# Patient Record
Sex: Male | Born: 1975 | Race: White | Hispanic: No | Marital: Single | Smoking: Never smoker
Health system: Southern US, Community
[De-identification: ages and names within clinical notes are randomized; demographics above are authoritative.]

## PROBLEM LIST (undated history)

## (undated) DIAGNOSIS — M539 Dorsopathy, unspecified: Secondary | ICD-10-CM

## (undated) HISTORY — DX: Dorsopathy, unspecified: M53.9

## (undated) HISTORY — PX: APPENDECTOMY: SHX54

---

## 2005-07-01 ENCOUNTER — Inpatient Hospital Stay (HOSPITAL_COMMUNITY): Admission: EM | Admit: 2005-07-01 | Discharge: 2005-07-01 | Payer: Self-pay | Admitting: Emergency Medicine

## 2006-11-27 IMAGING — CT CT PELVIS W/ CM
1 of 6 series · 12 of 32 positions shown, 18 images · IV contrast (100 ML OMNI 300)
Comparison: none

CLINICAL DATA: Question appendicitis. 
 ABDOMEN CT WITH CONTRAST:
TECHNIQUE: Multidetector CT imaging of the abdomen was performed following the standard protocol during bolus administration of intravenous contrast.
 Contrast:  100 cc Omnipaque 300
TECHNIQUE: Multidetector CT imaging of the pelvis was performed following the standard protocol during bolus administration of intravenous contrast.

[Series 102: routine abdomen · axial · 0.81mm/px · z∈[-553,-146]mm · 12 of 385 slices shown, 18 images]
[im 30/385  soft-tissue]
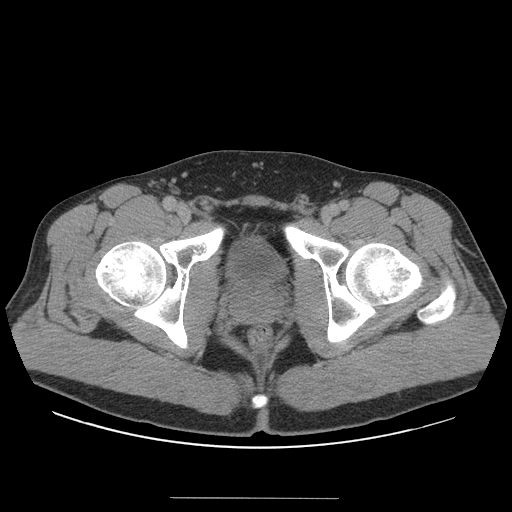
[im 30/385  bone]
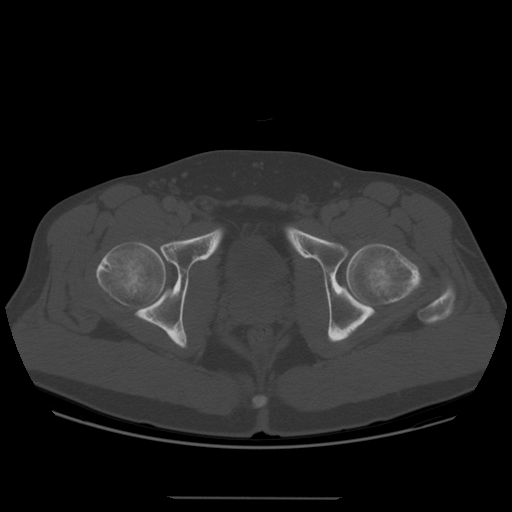
[im 60/385  soft-tissue]
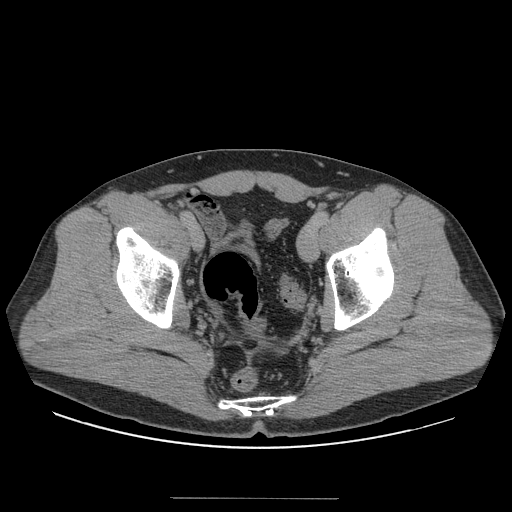
[im 89/385  soft-tissue]
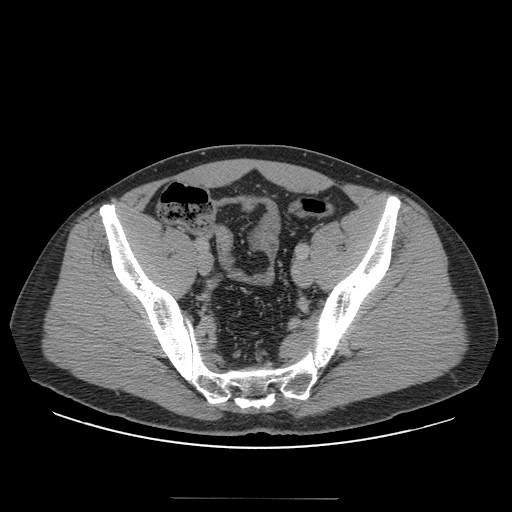
[im 119/385  soft-tissue]
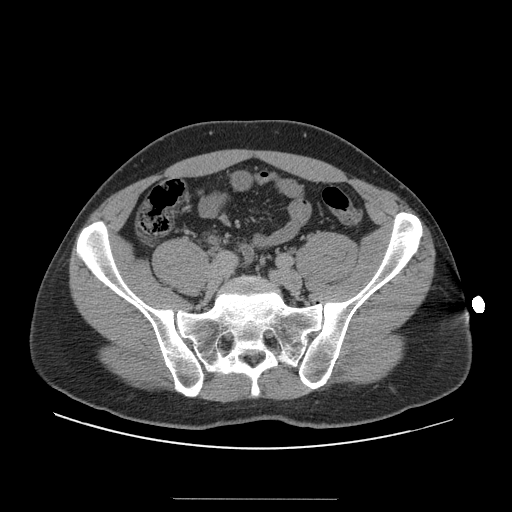
[im 148/385  soft-tissue]
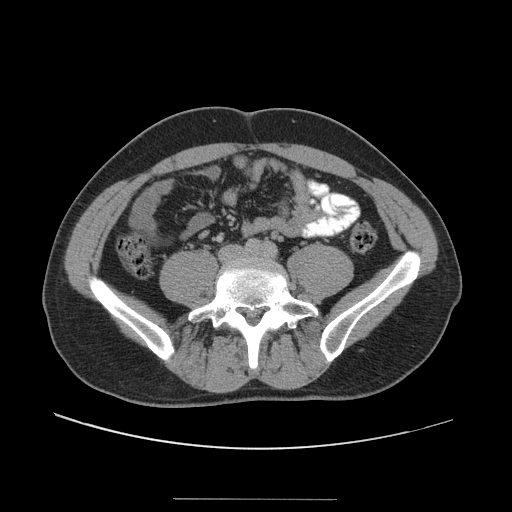
[im 178/385  soft-tissue]
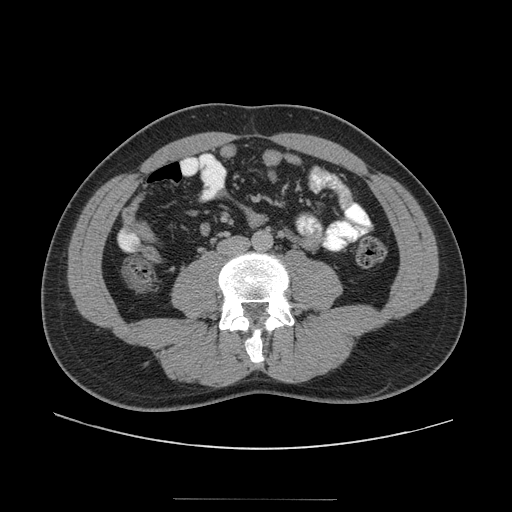
[im 207/385  soft-tissue]
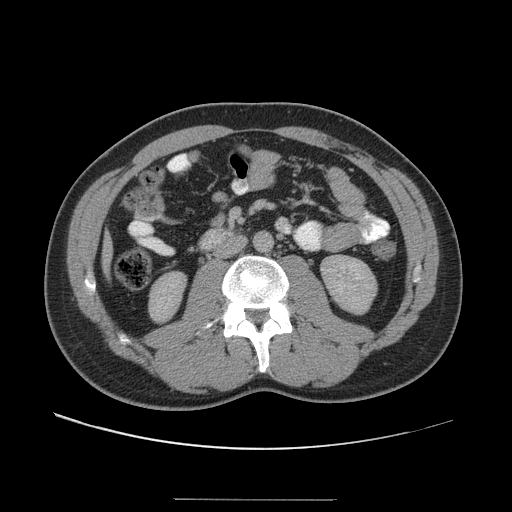
[im 237/385  soft-tissue]
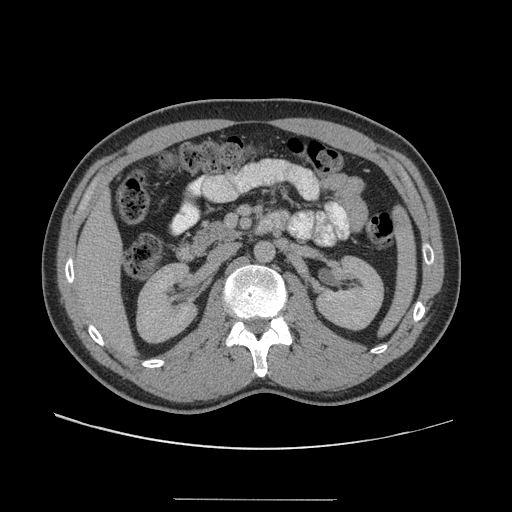
[im 266/385  soft-tissue]
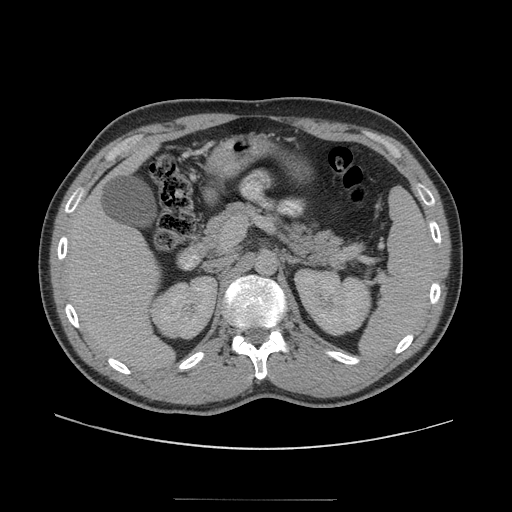
[im 266/385  lung]
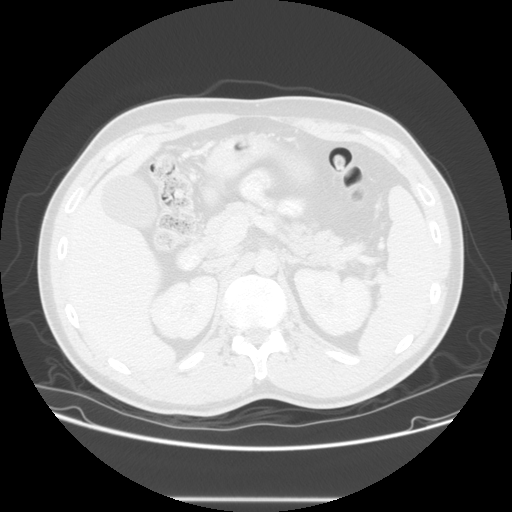
[im 266/385  bone]
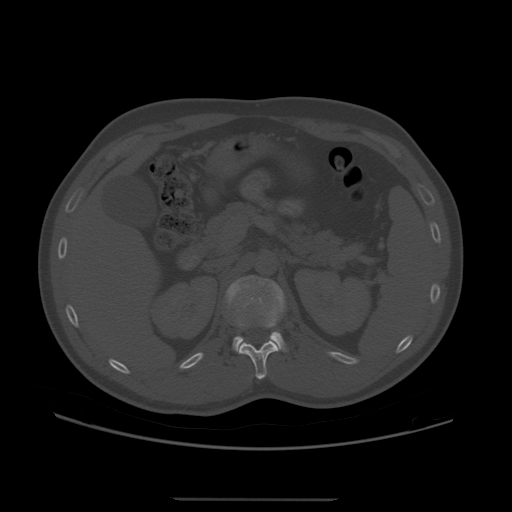
[im 296/385  soft-tissue]
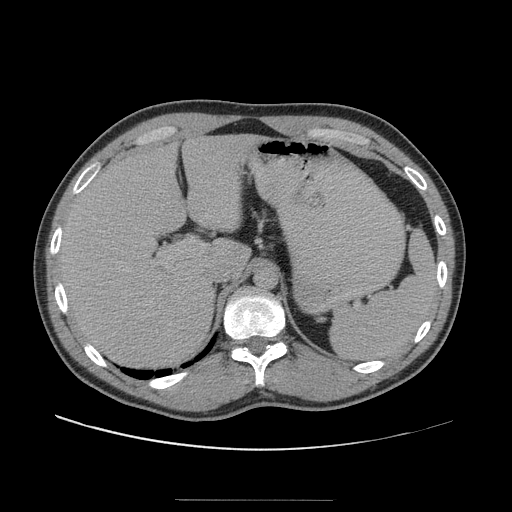
[im 296/385  lung]
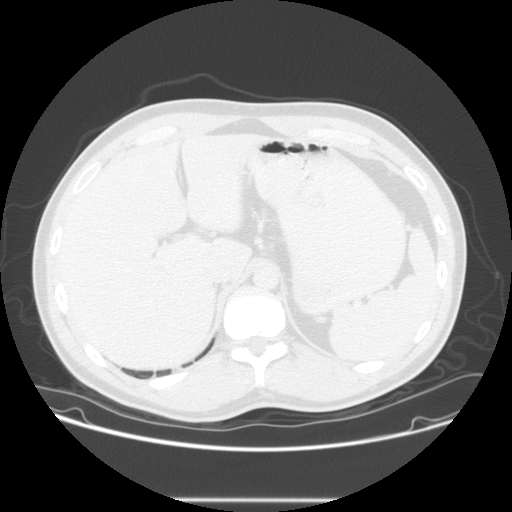
[im 325/385  soft-tissue]
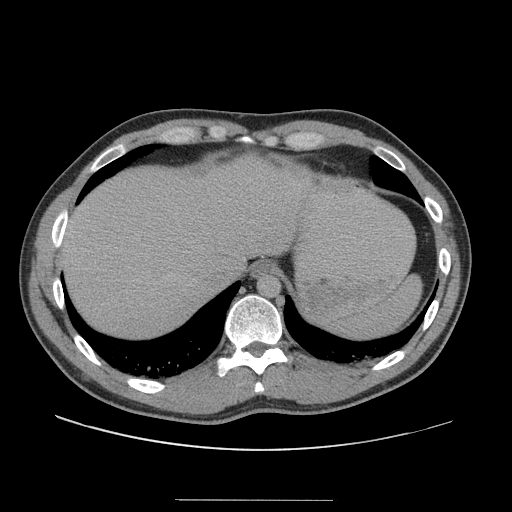
[im 325/385  lung]
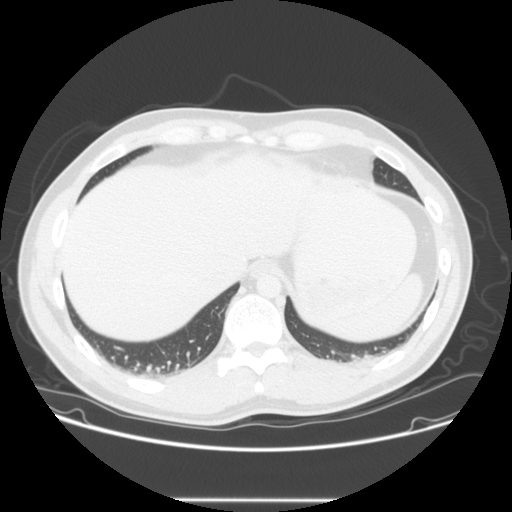
[im 355/385  soft-tissue]
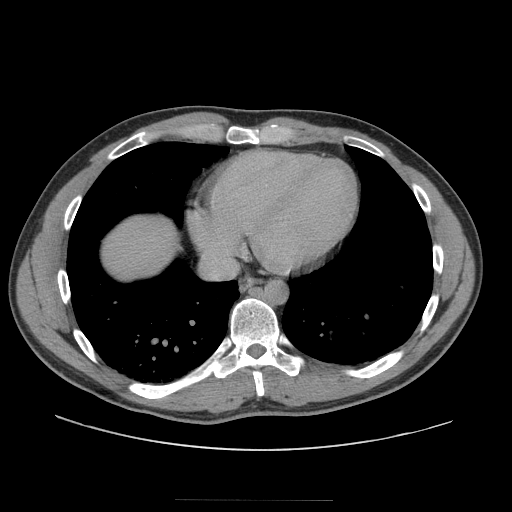
[im 355/385  lung]
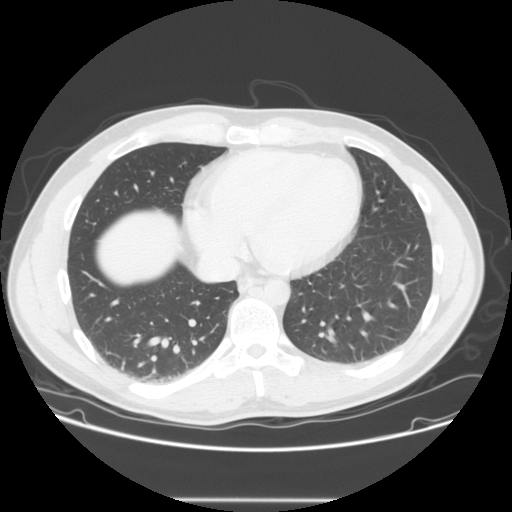

[12 of 32 positions shown; findings below may reference images not displayed]

FINDINGS: The lung bases are clear.  
 No pleural or pericardial effusions. 
 The liver is normal in attenuation and morphology.  
 There is no intrahepatic biliary ductal dilatation.  
 The gallbladder is negative.  
 The spleen is negative.  
 The pancreas is negative.  
 Left kidney negative.  Right kidney negative.  Adrenal glands are negative.  Retroperitoneal lymph nodes are non-pathologically enlarged.  Mesenteric lymph nodes are non-pathologically enlarged.  Small bowel loops are all normal.  There is trace free fluid within the right lower quadrant.  The appendix is abnormally thickened with periappendiceal fat stranding consistent with acute appendicitis.
IMPRESSION: Abnormally thickened appendix with periappendiceal fat stranding consistent with acute appendicitis. 
 PELVIS CT WITH CONTRAST:
FINDINGS: There is trace free fluid.  Thickened appendix is noted.  Findings consistent with acute appendicitis.
IMPRESSION: Acute appendicitis.

## 2013-11-06 ENCOUNTER — Ambulatory Visit: Payer: Self-pay | Admitting: Podiatrist

## 2013-11-13 ENCOUNTER — Encounter: Payer: Self-pay | Admitting: Podiatrist

## 2013-11-13 ENCOUNTER — Ambulatory Visit (INDEPENDENT_AMBULATORY_CARE_PROVIDER_SITE_OTHER): Payer: BC Managed Care – PPO

## 2013-11-13 ENCOUNTER — Ambulatory Visit (INDEPENDENT_AMBULATORY_CARE_PROVIDER_SITE_OTHER): Payer: BC Managed Care – PPO | Admitting: Podiatrist

## 2013-11-13 VITALS — BP 110/74 | HR 69 | Resp 16 | Ht 76.0 in | Wt 230.0 lb

## 2013-11-13 DIAGNOSIS — M722 Plantar fascial fibromatosis: Secondary | ICD-10-CM

## 2013-11-13 NOTE — Progress Notes (Signed)
   Subjective:    Patient ID: Brett Moran, male    DOB: 08/19/1975, 38 y.o.   MRN: [id number]  HPI Comments: "I feel like a I have plantar fasciitis"  Patient c/o aching plantar heels bilateral, left over right, for about 3-4 months. He does have some AM pain. He initially was having sharp pains medial heel and foot, but then it moved more into the heels. He has tried a night brace, takes naproxen for his back, ultrasound treatments from chiropractor-no help.     Review of Systems  Musculoskeletal: Positive for back pain.  All other systems reviewed and are negative.      Objective:   Physical Exam GENERAL APPEARANCE: Alert, conversant. Appropriately groomed. No acute distress.  VASCULAR: Pedal pulses palpable and strong bilateral.  Capillary refill time is immediate to all digits,  Proximal to distal cooling it warm to warm.  Digital hair growth is present bilateral  NEUROLOGIC: sensation is intact epicritically and protectively to 5.07 monofilament at 5/5 sites bilateral.  Light touch is intact bilateral, vibratory sensation intact bilateral, achilles tendon reflex is intact bilateral.  Negative Tinel sign is elicited MUSCULOSKELETAL: Pain on palpation plantar medial aspect both feet  at insertion of plantar fascia on the medial calcaneal tubercle. Inflammation at the insertion of the plantar fascia is present. Rectus foot type is seen.  In general the pain is improving with the conservative treatments he has been performing and the pad in his shoe DERMATOLOGIC: skin color, texture, and turger are within normal limits.  No preulcerative lesions are seen, no interdigital maceration noted.  No open lesions present.  Digital nails are asymptomatic.      Assessment & Plan:  Plantar fasciitis bilateral improving  Plan: Recommended orthotics and he was scanned at today's visit recommended continuing good stretching and icing exercises as well as use of a night splint. He will be seen back  when the inserts are ready for pick up and if the pain continues an injection will be  warranted at that time.

## 2013-11-13 NOTE — Patient Instructions (Signed)

## 2013-12-13 ENCOUNTER — Other Ambulatory Visit: Payer: BC Managed Care – PPO

## 2013-12-17 ENCOUNTER — Ambulatory Visit (INDEPENDENT_AMBULATORY_CARE_PROVIDER_SITE_OTHER): Payer: BC Managed Care – PPO | Admitting: *Deleted

## 2013-12-17 DIAGNOSIS — M722 Plantar fascial fibromatosis: Secondary | ICD-10-CM

## 2013-12-17 NOTE — Patient Instructions (Signed)

## 2013-12-17 NOTE — Progress Notes (Signed)
   Subjective:    Patient ID: Brett Moran, male    DOB: [birth year], 38 y.o.   MRN: [id number]  HPI  PICK UP  ORTHOTICS AND GIVEN    Review of Systems     Objective:   Physical Exam        Assessment & Plan:

## 2016-02-23 ENCOUNTER — Ambulatory Visit: Payer: Self-pay | Admitting: Sports Medicine

## 2016-03-01 ENCOUNTER — Encounter: Payer: Self-pay | Admitting: Sports Medicine

## 2016-03-01 ENCOUNTER — Ambulatory Visit (INDEPENDENT_AMBULATORY_CARE_PROVIDER_SITE_OTHER): Payer: BLUE CROSS/BLUE SHIELD | Admitting: Sports Medicine

## 2016-03-01 DIAGNOSIS — M79672 Pain in left foot: Secondary | ICD-10-CM

## 2016-03-01 DIAGNOSIS — M79671 Pain in right foot: Secondary | ICD-10-CM

## 2016-03-01 DIAGNOSIS — M722 Plantar fascial fibromatosis: Secondary | ICD-10-CM | POA: Diagnosis not present

## 2016-03-01 NOTE — Progress Notes (Signed)
Subjective: Brett Moran is a 40 y.o. male patient presents to office for evaluation of bilateral heels and for new orthotics. Reports was treated for plantar fasciitis well controlled with orthotics with occasional flare ups and desires new orthotics. Denies any other pedal complaints.   There are no active problems to display for this patient.   Current Outpatient Prescriptions on File Prior to Visit  Medication Sig Dispense Refill  . naproxen (NAPROSYN) 500 MG tablet Take 500 mg by mouth as needed.      No current facility-administered medications on file prior to visit.     No Known Allergies  Objective: Physical Exam General: The patient is alert and oriented x3 in no acute distress.  Dermatology: Skin is warm, dry and supple bilateral lower extremities. Nails 1-10 are normal. There is no erythema, edema, no eccymosis, no open lesions present. Integument is otherwise unremarkable.  Vascular: Dorsalis Pedis pulse and Posterior Tibial pulse are 2/4 bilateral. Capillary fill time is immediate to all digits.  Neurological: Grossly intact to light touch with an achilles reflex of +2/5 and a negative Tinel's sign bilateral.  Musculoskeletal: No reproducibleTenderness to palpation at the medial calcaneal tubercale and through the insertion of the plantar fascia on the left or right foot. No pain with compression of calcaneus bilateral. No pain with tuning fork to calcaneus bilateral. No pain with calf compression bilateral. There is decreased Ankle joint range of motion bilateral. All other joints range of motion within normal limits bilateral. Strength 5/5 in all groups bilateral. Rectus cavus foot type  Current orthotics: No longer provide adaquet support.   Assessment and Plan: Problem List Items Addressed This Visit    None    Visit Diagnoses    Plantar fasciitis, bilateral    -  Primary   Foot pain, bilateral          -Complete examination performed.  -Previous Xrays  reviewed -Discussed with patient in detail the condition of plantar fasciitis, how this occurs and general treatment options. Explained both conservative and surgical treatments.  -Recommend stretching, ice, and motrin for occasional flare ups -Recommended good supportive shoes and  custom molded orthoses. Patient scanned for a new set of custom orthotics with heel offloading, deep heel cup, full length, [facility] -Patient to return to office to pick up orthotics or sooner if problems or questions arise.  Asencion Islamitorya Tre Sanker, DPM

## 2016-03-01 NOTE — Patient Instructions (Signed)

## 2016-03-23 ENCOUNTER — Ambulatory Visit (INDEPENDENT_AMBULATORY_CARE_PROVIDER_SITE_OTHER): Payer: BLUE CROSS/BLUE SHIELD | Admitting: Sports Medicine

## 2016-03-23 DIAGNOSIS — M722 Plantar fascial fibromatosis: Secondary | ICD-10-CM

## 2016-03-23 DIAGNOSIS — M79672 Pain in left foot: Secondary | ICD-10-CM

## 2016-03-23 DIAGNOSIS — M79671 Pain in right foot: Secondary | ICD-10-CM

## 2016-03-23 NOTE — Progress Notes (Signed)
Patient discussed with medical assistant. One pair of Custom Functional orthotics were fitted and dispensed to patient with wear instructions and break in period explained. Patient to follow up as scheduled for continued care or sooner if problems or issues arise. -Dr. Bernadetta Roell  

## 2017-07-21 DIAGNOSIS — Z0100 Encounter for examination of eyes and vision without abnormal findings: Secondary | ICD-10-CM | POA: Diagnosis not present

## 2018-04-12 DIAGNOSIS — Z131 Encounter for screening for diabetes mellitus: Secondary | ICD-10-CM | POA: Diagnosis not present

## 2018-04-12 DIAGNOSIS — Z0000 Encounter for general adult medical examination without abnormal findings: Secondary | ICD-10-CM | POA: Diagnosis not present

## 2018-04-12 DIAGNOSIS — Z13220 Encounter for screening for lipoid disorders: Secondary | ICD-10-CM | POA: Diagnosis not present

## 2018-06-05 DIAGNOSIS — B078 Other viral warts: Secondary | ICD-10-CM | POA: Diagnosis not present

## 2018-06-05 DIAGNOSIS — D1801 Hemangioma of skin and subcutaneous tissue: Secondary | ICD-10-CM | POA: Diagnosis not present

## 2018-06-05 DIAGNOSIS — D0422 Carcinoma in situ of skin of left ear and external auricular canal: Secondary | ICD-10-CM | POA: Diagnosis not present

## 2018-06-21 DIAGNOSIS — D0422 Carcinoma in situ of skin of left ear and external auricular canal: Secondary | ICD-10-CM | POA: Diagnosis not present

## 2018-07-23 DIAGNOSIS — L308 Other specified dermatitis: Secondary | ICD-10-CM | POA: Diagnosis not present

## 2018-07-23 DIAGNOSIS — D0422 Carcinoma in situ of skin of left ear and external auricular canal: Secondary | ICD-10-CM | POA: Diagnosis not present

## 2018-07-27 DIAGNOSIS — H52222 Regular astigmatism, left eye: Secondary | ICD-10-CM | POA: Diagnosis not present

## 2018-07-27 DIAGNOSIS — H5203 Hypermetropia, bilateral: Secondary | ICD-10-CM | POA: Diagnosis not present

## 2018-07-27 DIAGNOSIS — H524 Presbyopia: Secondary | ICD-10-CM | POA: Diagnosis not present

## 2018-09-27 DIAGNOSIS — L03116 Cellulitis of left lower limb: Secondary | ICD-10-CM | POA: Diagnosis not present

## 2019-01-31 DIAGNOSIS — L814 Other melanin hyperpigmentation: Secondary | ICD-10-CM | POA: Diagnosis not present

## 2019-01-31 DIAGNOSIS — D1801 Hemangioma of skin and subcutaneous tissue: Secondary | ICD-10-CM | POA: Diagnosis not present

## 2019-01-31 DIAGNOSIS — D225 Melanocytic nevi of trunk: Secondary | ICD-10-CM | POA: Diagnosis not present

## 2019-01-31 DIAGNOSIS — L918 Other hypertrophic disorders of the skin: Secondary | ICD-10-CM | POA: Diagnosis not present

## 2019-05-13 DIAGNOSIS — J019 Acute sinusitis, unspecified: Secondary | ICD-10-CM | POA: Diagnosis not present

## 2019-05-13 DIAGNOSIS — Z1159 Encounter for screening for other viral diseases: Secondary | ICD-10-CM | POA: Diagnosis not present

## 2019-05-13 DIAGNOSIS — Z20828 Contact with and (suspected) exposure to other viral communicable diseases: Secondary | ICD-10-CM | POA: Diagnosis not present

## 2019-09-08 DIAGNOSIS — U071 COVID-19: Secondary | ICD-10-CM | POA: Diagnosis not present

## 2019-09-08 DIAGNOSIS — Z1152 Encounter for screening for COVID-19: Secondary | ICD-10-CM | POA: Diagnosis not present

## 2019-10-05 DIAGNOSIS — L03116 Cellulitis of left lower limb: Secondary | ICD-10-CM | POA: Diagnosis not present

## 2019-10-24 DIAGNOSIS — Z85828 Personal history of other malignant neoplasm of skin: Secondary | ICD-10-CM | POA: Diagnosis not present

## 2019-10-24 DIAGNOSIS — L570 Actinic keratosis: Secondary | ICD-10-CM | POA: Diagnosis not present

## 2019-10-24 DIAGNOSIS — L905 Scar conditions and fibrosis of skin: Secondary | ICD-10-CM | POA: Diagnosis not present

## 2019-10-24 DIAGNOSIS — L300 Nummular dermatitis: Secondary | ICD-10-CM | POA: Diagnosis not present

## 2020-07-15 DIAGNOSIS — U071 COVID-19: Secondary | ICD-10-CM | POA: Diagnosis not present

## 2020-11-15 DIAGNOSIS — Z03818 Encounter for observation for suspected exposure to other biological agents ruled out: Secondary | ICD-10-CM | POA: Diagnosis not present

## 2020-11-15 DIAGNOSIS — N390 Urinary tract infection, site not specified: Secondary | ICD-10-CM | POA: Diagnosis not present

## 2020-11-17 DIAGNOSIS — R300 Dysuria: Secondary | ICD-10-CM | POA: Diagnosis not present

## 2020-11-18 DIAGNOSIS — R300 Dysuria: Secondary | ICD-10-CM | POA: Diagnosis not present

## 2020-11-18 DIAGNOSIS — R109 Unspecified abdominal pain: Secondary | ICD-10-CM | POA: Diagnosis not present

## 2020-11-20 DIAGNOSIS — R748 Abnormal levels of other serum enzymes: Secondary | ICD-10-CM | POA: Diagnosis not present

## 2020-12-21 DIAGNOSIS — J019 Acute sinusitis, unspecified: Secondary | ICD-10-CM | POA: Diagnosis not present

## 2021-04-26 DIAGNOSIS — H524 Presbyopia: Secondary | ICD-10-CM | POA: Diagnosis not present

## 2021-04-26 DIAGNOSIS — H5203 Hypermetropia, bilateral: Secondary | ICD-10-CM | POA: Diagnosis not present

## 2021-04-26 DIAGNOSIS — H52222 Regular astigmatism, left eye: Secondary | ICD-10-CM | POA: Diagnosis not present

## 2021-06-03 DIAGNOSIS — J069 Acute upper respiratory infection, unspecified: Secondary | ICD-10-CM | POA: Diagnosis not present

## 2021-06-15 DIAGNOSIS — M7522 Bicipital tendinitis, left shoulder: Secondary | ICD-10-CM | POA: Diagnosis not present

## 2021-09-06 DIAGNOSIS — S46012A Strain of muscle(s) and tendon(s) of the rotator cuff of left shoulder, initial encounter: Secondary | ICD-10-CM | POA: Diagnosis not present

## 2021-09-22 DIAGNOSIS — M25512 Pain in left shoulder: Secondary | ICD-10-CM | POA: Diagnosis not present

## 2022-09-01 DIAGNOSIS — Z23 Encounter for immunization: Secondary | ICD-10-CM | POA: Diagnosis not present

## 2022-09-01 DIAGNOSIS — Z0000 Encounter for general adult medical examination without abnormal findings: Secondary | ICD-10-CM | POA: Diagnosis not present

## 2022-09-01 DIAGNOSIS — Z125 Encounter for screening for malignant neoplasm of prostate: Secondary | ICD-10-CM | POA: Diagnosis not present

## 2022-09-01 DIAGNOSIS — E781 Pure hyperglyceridemia: Secondary | ICD-10-CM | POA: Diagnosis not present

## 2022-09-01 DIAGNOSIS — Z1211 Encounter for screening for malignant neoplasm of colon: Secondary | ICD-10-CM | POA: Diagnosis not present

## 2022-12-06 DIAGNOSIS — K5730 Diverticulosis of large intestine without perforation or abscess without bleeding: Secondary | ICD-10-CM | POA: Diagnosis not present

## 2022-12-06 DIAGNOSIS — D122 Benign neoplasm of ascending colon: Secondary | ICD-10-CM | POA: Diagnosis not present

## 2022-12-06 DIAGNOSIS — Z1211 Encounter for screening for malignant neoplasm of colon: Secondary | ICD-10-CM | POA: Diagnosis not present

## 2023-08-21 DIAGNOSIS — H524 Presbyopia: Secondary | ICD-10-CM | POA: Diagnosis not present

## 2023-08-21 DIAGNOSIS — L0292 Furuncle, unspecified: Secondary | ICD-10-CM | POA: Diagnosis not present

## 2023-08-21 DIAGNOSIS — H52223 Regular astigmatism, bilateral: Secondary | ICD-10-CM | POA: Diagnosis not present

## 2023-08-21 DIAGNOSIS — L723 Sebaceous cyst: Secondary | ICD-10-CM | POA: Diagnosis not present

## 2023-08-21 DIAGNOSIS — H5203 Hypermetropia, bilateral: Secondary | ICD-10-CM | POA: Diagnosis not present

## 2023-09-11 DIAGNOSIS — Z0000 Encounter for general adult medical examination without abnormal findings: Secondary | ICD-10-CM | POA: Diagnosis not present

## 2023-09-11 DIAGNOSIS — M25512 Pain in left shoulder: Secondary | ICD-10-CM | POA: Diagnosis not present

## 2023-09-11 DIAGNOSIS — E782 Mixed hyperlipidemia: Secondary | ICD-10-CM | POA: Diagnosis not present

## 2023-09-11 DIAGNOSIS — G8929 Other chronic pain: Secondary | ICD-10-CM | POA: Diagnosis not present

## 2024-02-08 ENCOUNTER — Ambulatory Visit
Admission: EM | Admit: 2024-02-08 | Discharge: 2024-02-08 | Disposition: A | Discharge status: Home / Self Care | Attending: Family Medicine | Admitting: Family Medicine

## 2024-02-08 DIAGNOSIS — B9789 Other viral agents as the cause of diseases classified elsewhere: Secondary | ICD-10-CM | POA: Diagnosis not present

## 2024-02-08 DIAGNOSIS — J988 Other specified respiratory disorders: Secondary | ICD-10-CM

## 2024-02-08 NOTE — ED Triage Notes (Signed)
 Pt reports nasal congestion, bilateral ear pressure, sore throat, cough   x 4 days. OTC cold meds give some relief.

## 2024-02-08 NOTE — ED Provider Notes (Signed)
 [facility]  Note:  This document was prepared using Conservation officer, historic buildings and may include unintentional dictation errors.  MRN: [id number] DOB: [birth year]  Subjective:   Brett Moran is a 48 y.o. male presenting for 4 day history of sinus congestion, sinus drainage, throat pain, bilateral ear fullness, body aches. Has had a productive cough. No asthma. No smoking of any kind including cigarettes, cigars, vaping, marijuana use.  Does not want respiratory testing.   No current facility-administered medications for this encounter.  Current Outpatient Medications:    naproxen (NAPROSYN) 500 MG tablet, Take 500 mg by mouth as needed. , Disp: , Rfl:    No Known Allergies  Past Medical History:  Diagnosis Date   Back problem      Past Surgical History:  Procedure Laterality Date   APPENDECTOMY      History reviewed. No pertinent family history.  Social History   Tobacco Use   Smoking status: Never   Smokeless tobacco: Never  Vaping Use   Vaping status: Never Used  Substance Use Topics   Alcohol use: Yes    Comment: Occa   Drug use: Never    ROS   Objective:   Vitals: BP 115/82 (BP Location: Right Arm)   Pulse 71   Temp 98.5 F (36.9 C) (Oral)   Resp 18   SpO2 95%   Physical Exam Constitutional:      General: He is not in acute distress.    Appearance: Normal appearance. He is well-developed and normal weight. He is not ill-appearing, toxic-appearing or diaphoretic.  HENT:     Head: Normocephalic and atraumatic.     Right Ear: Ear canal and external ear normal. No drainage, swelling or tenderness. No middle ear effusion. There is no impacted cerumen. Tympanic membrane is not erythematous or bulging.     Left Ear: Ear canal and external ear normal. No drainage, swelling or tenderness.  No middle ear effusion. There is no impacted cerumen. Tympanic membrane is not erythematous or bulging.     Ears:     Comments: Mild  bilateral ear effusion without erythema or bulging of the TM.    Nose: Congestion present. No rhinorrhea.     Mouth/Throat:     Mouth: Mucous membranes are moist.     Pharynx: No oropharyngeal exudate or posterior oropharyngeal erythema.     Comments: Streaks of postnasal drainage overlying pharynx. Eyes:     General: No scleral icterus.       Right eye: No discharge.        Left eye: No discharge.     Extraocular Movements: Extraocular movements intact.     Conjunctiva/sclera: Conjunctivae normal.  Cardiovascular:     Rate and Rhythm: Normal rate and regular rhythm.     Heart sounds: Normal heart sounds. No murmur heard.    No friction rub. No gallop.  Pulmonary:     Effort: Pulmonary effort is normal. No respiratory distress.     Breath sounds: Normal breath sounds. No stridor. No wheezing, rhonchi or rales.  Musculoskeletal:     Cervical back: Normal range of motion and neck supple. No rigidity. No muscular tenderness.  Neurological:     General: No focal deficit present.     Mental Status: He is alert and oriented to person, place, and time.  Psychiatric:        Mood and Affect: Mood normal.        Behavior: Behavior normal.  Thought Content: Thought content normal.     Assessment and Plan :   PDMP not reviewed this encounter.  1. Viral respiratory infection    Patient declined testing and prescriptions.  Deferred imaging given clear cardiopulmonary exam, hemodynamically stable vital signs.  Suspect viral URI, viral syndrome. Physical exam findings reassuring and vital signs stable for discharge. Advised supportive care, offered symptomatic relief. Counseled patient on potential for adverse effects with medications prescribed/recommended today, ER and return-to-clinic precautions discussed, patient verbalized understanding.     Christopher Savannah, [place] 02/08/24 1115

## 2024-02-08 NOTE — Discharge Instructions (Signed)
 We will manage this as a viral illness. For sore throat or cough try using a honey-based tea. Use 3 teaspoons of honey with juice squeezed from half lemon. Place shaved pieces of ginger into 1/2-1 cup of water and warm over stove top. Then mix the ingredients and repeat every 4 hours as needed. Please take ibuprofen 600mg  every 6 hours with food alternating with OR taken together with Tylenol 500mg -650mg  every 6 hours for throat pain, fevers, aches and pains. Hydrate very well with at least 2 liters of water. Eat light meals such as soups (chicken and noodles, vegetable, chicken and wild rice).  Do not eat foods that you are allergic to.  Taking an antihistamine like Zyrtec (10mg  daily) can help against postnasal drainage, sinus congestion which can cause sinus pain, sinus headaches, throat pain, painful swallowing, coughing.  You can take this together with pseudoephedrine (Sudafed) at a dose of 30mg  3 times a day or twice daily as needed for the same kind of nasal drip, congestion.
